# Patient Record
Sex: Male | Born: 2000 | Race: Black or African American | Hispanic: No | Marital: Single | Smoking: Never smoker
Health system: Southern US, Community
[De-identification: ages and names within clinical notes are randomized; demographics above are authoritative.]

## PROBLEM LIST (undated history)

## (undated) DIAGNOSIS — J45909 Unspecified asthma, uncomplicated: Secondary | ICD-10-CM

---
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
## 2020-04-04 IMAGING — CT CT RENAL STONE PROTOCOL
2 of 4 series · 16 of 46 positions shown, 18 images · non-contrast
Comparison: None.

CLINICAL DATA: 17-year-old male with acute RIGHT flank and
abdominal pain today.

EXAM:
CT ABDOMEN AND PELVIS WITHOUT CONTRAST
TECHNIQUE: Multidetector CT imaging of the abdomen and pelvis was performed
following the standard protocol without IV contrast.

[Series 2: stone full standard · axial · 0.72mm/px · z∈[-496,-71]mm · 13 of 93 slices shown, 15 images]
[im 4/93  soft-tissue]
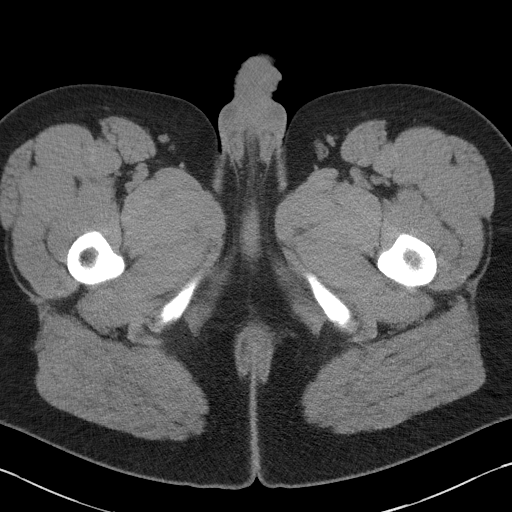
[im 4/93  bone]
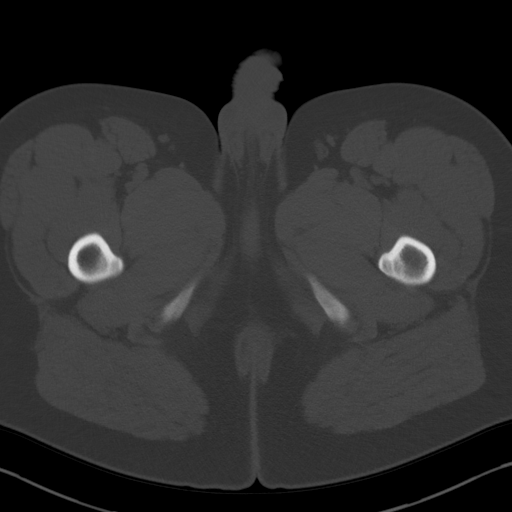
[im 12/93  soft-tissue]
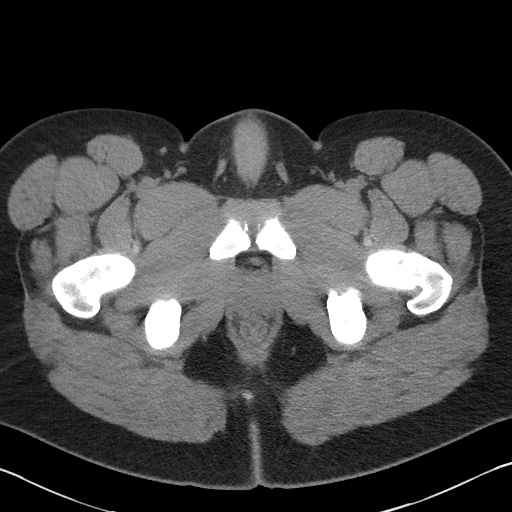
[im 20/93  soft-tissue]
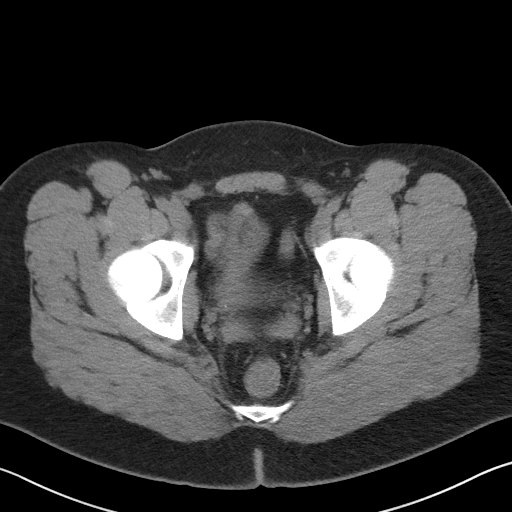
[im 27/93  soft-tissue]
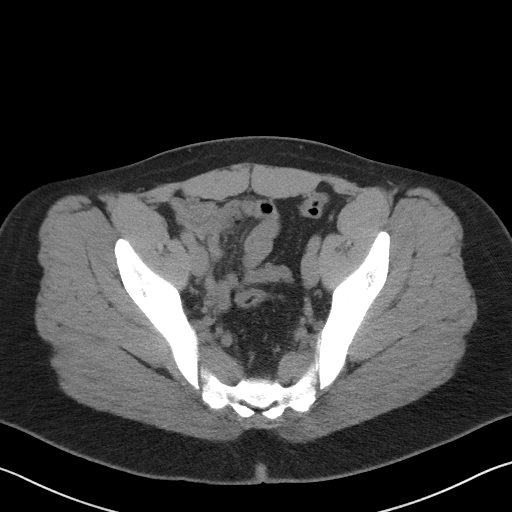
[im 31/93  soft-tissue]
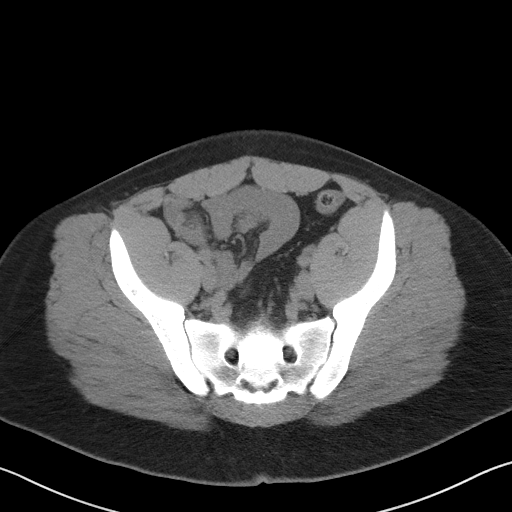
[im 39/93  soft-tissue]
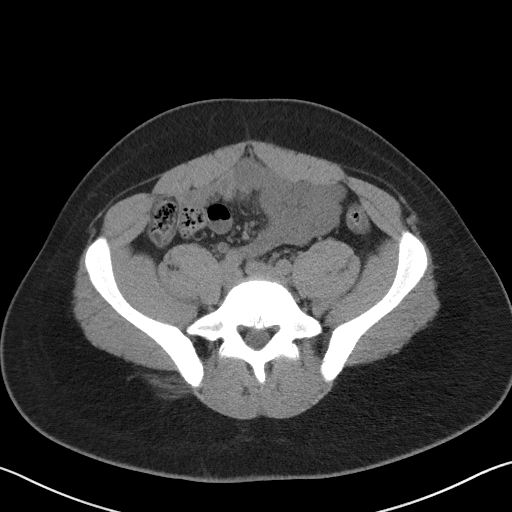
[im 47/93  soft-tissue]
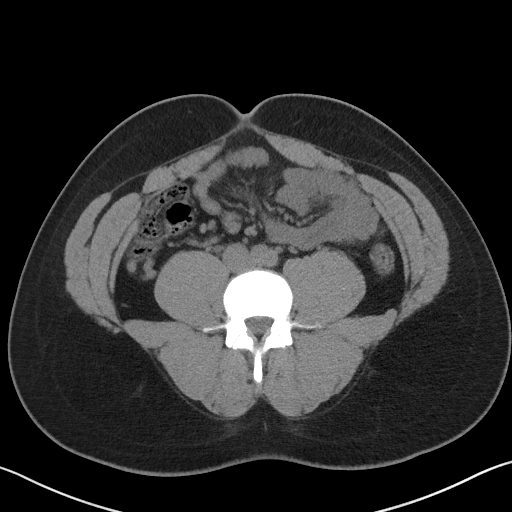
[im 54/93  soft-tissue]
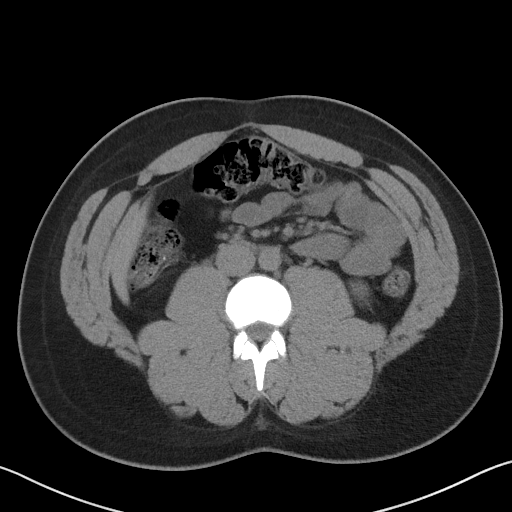
[im 62/93  soft-tissue]
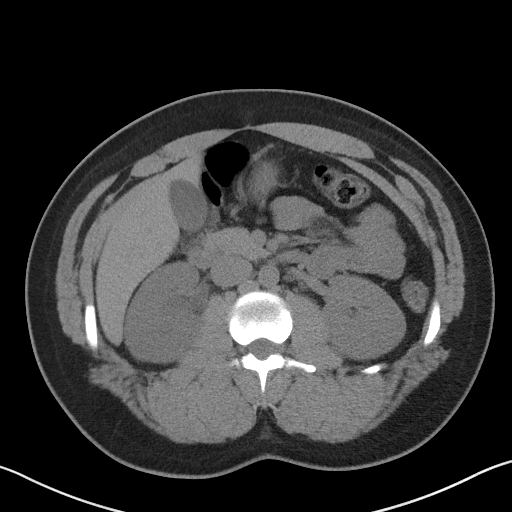
[im 62/93  bone]
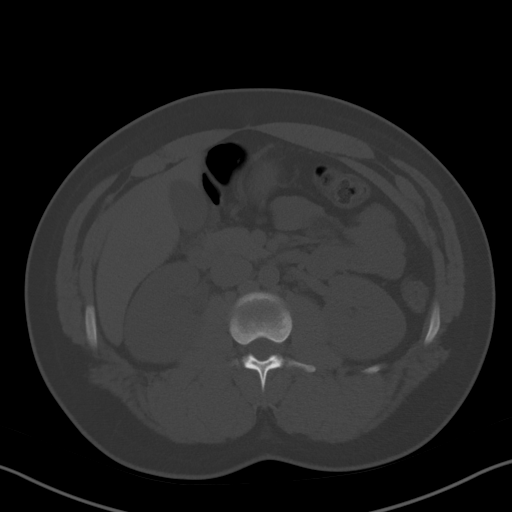
[im 66/93  soft-tissue]
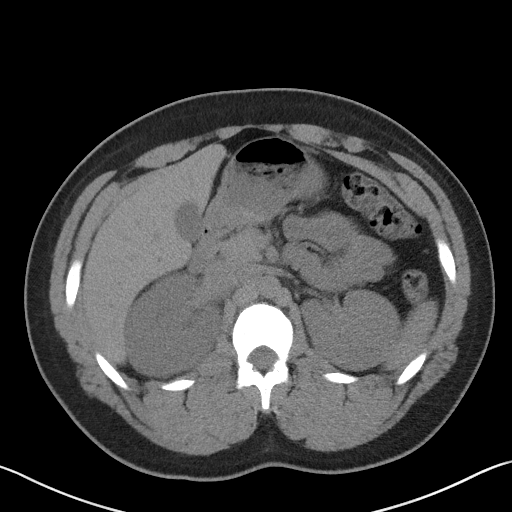
[im 73/93  soft-tissue]
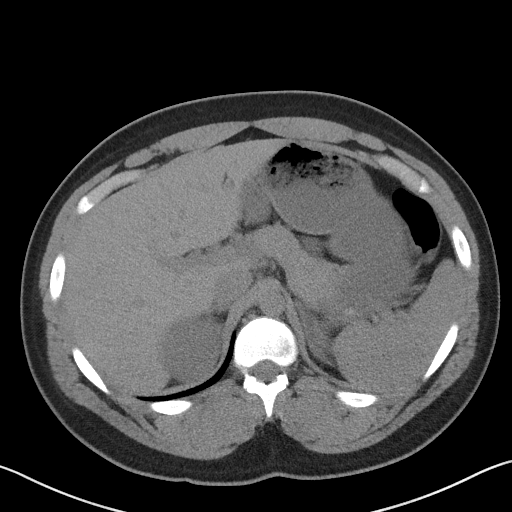
[im 81/93  soft-tissue]
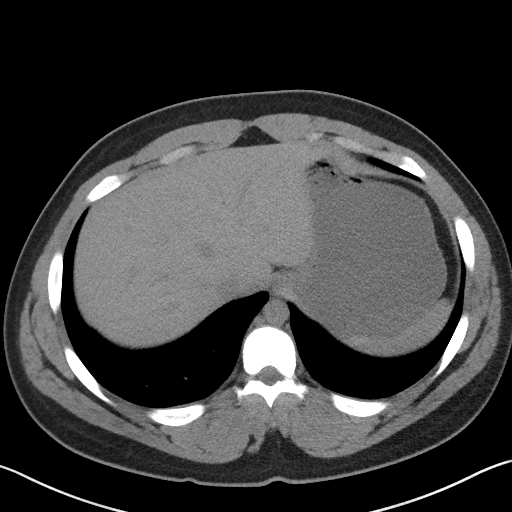
[im 89/93  soft-tissue]
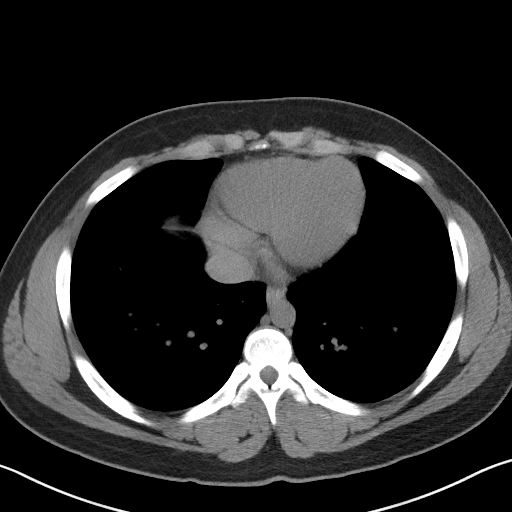

[Series 5: coronal · coronal · 0.72mm/px · 3 of 150 slices shown]
[im 50/150  soft-tissue]
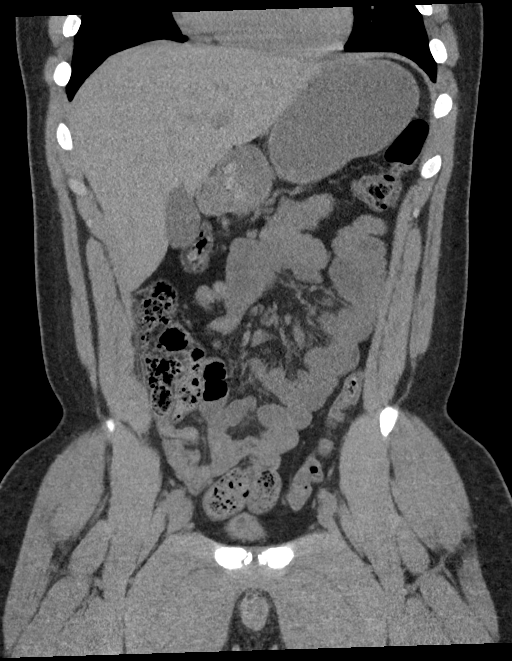
[im 67/150  soft-tissue]
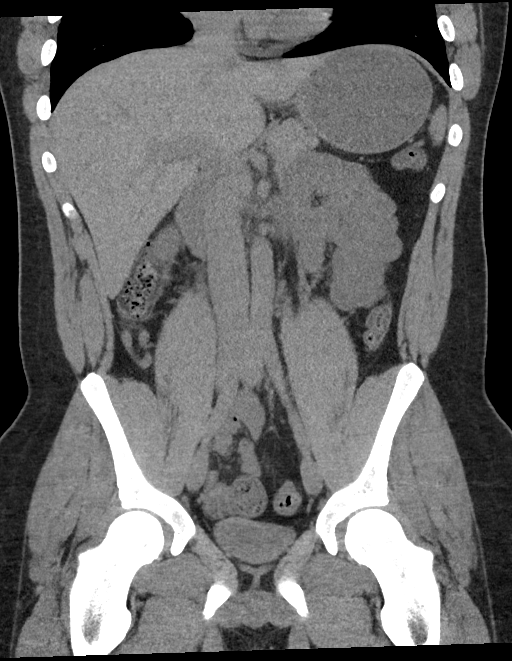
[im 83/150  soft-tissue]
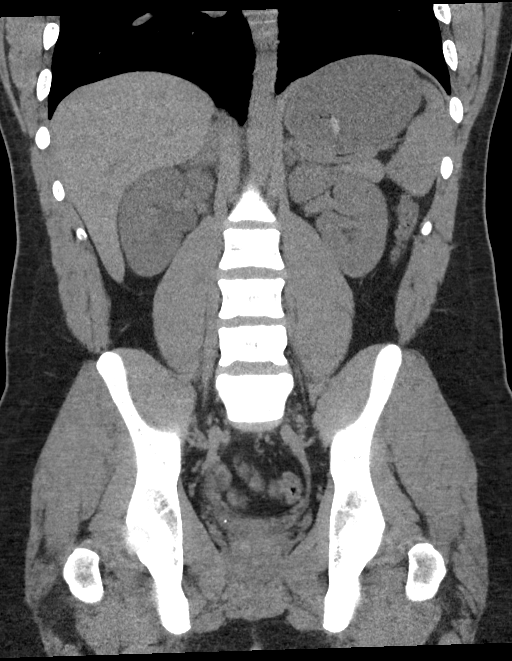

[16 of 46 positions shown; findings below may reference images not displayed]

FINDINGS: Please note that parenchymal abnormalities may be missed without
intravenous contrast.

Lower chest: No acute abnormality.

Hepatobiliary: The liver and gallbladder are unremarkable. No
biliary dilatation.

Pancreas: Unremarkable

Spleen: Unremarkable

Adrenals/Urinary Tract: A 1 mm RIGHT UVJ calculus causes mild RIGHT
hydroureteronephrosis. No other definite urinary calculi identified.
No other renal abnormalities noted. The adrenal glands are
unremarkable.

Stomach/Bowel: Stomach is within normal limits. Appendix appears
normal. No evidence of bowel wall thickening, distention, or
inflammatory changes.

Vascular/Lymphatic: No significant vascular findings are present. No
enlarged abdominal or pelvic lymph nodes.

Reproductive: Prostate is unremarkable.

Other: No ascites, pneumoperitoneum or definite collection.

Musculoskeletal: No acute or suspicious bony abnormalities.
IMPRESSION: 1 mm RIGHT UVJ calculus causing mild RIGHT hydronephrosis.

## 2021-09-09 IMAGING — CR DG KNEE COMPLETE 4+V*R*
1 series · 4 of 4 positions shown · non-contrast
Comparison: None.

CLINICAL DATA: Right knee pain

EXAM:
RIGHT KNEE - COMPLETE 4+ VIEW

[Series 1: dg knee complete 4 views right · 0.14mm/px · 4 of 4 slices shown]
[im 1/4]
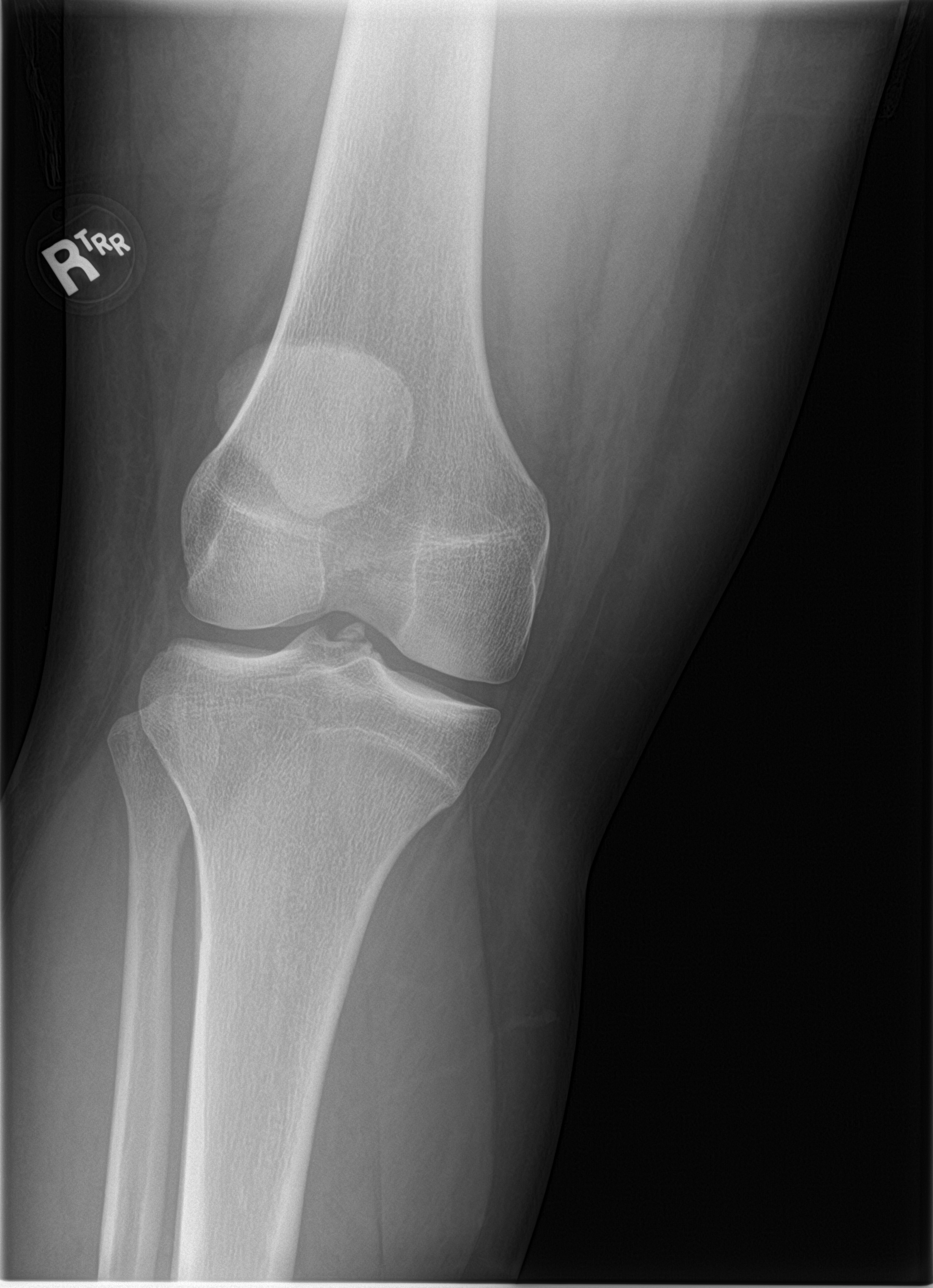
[im 2/4]
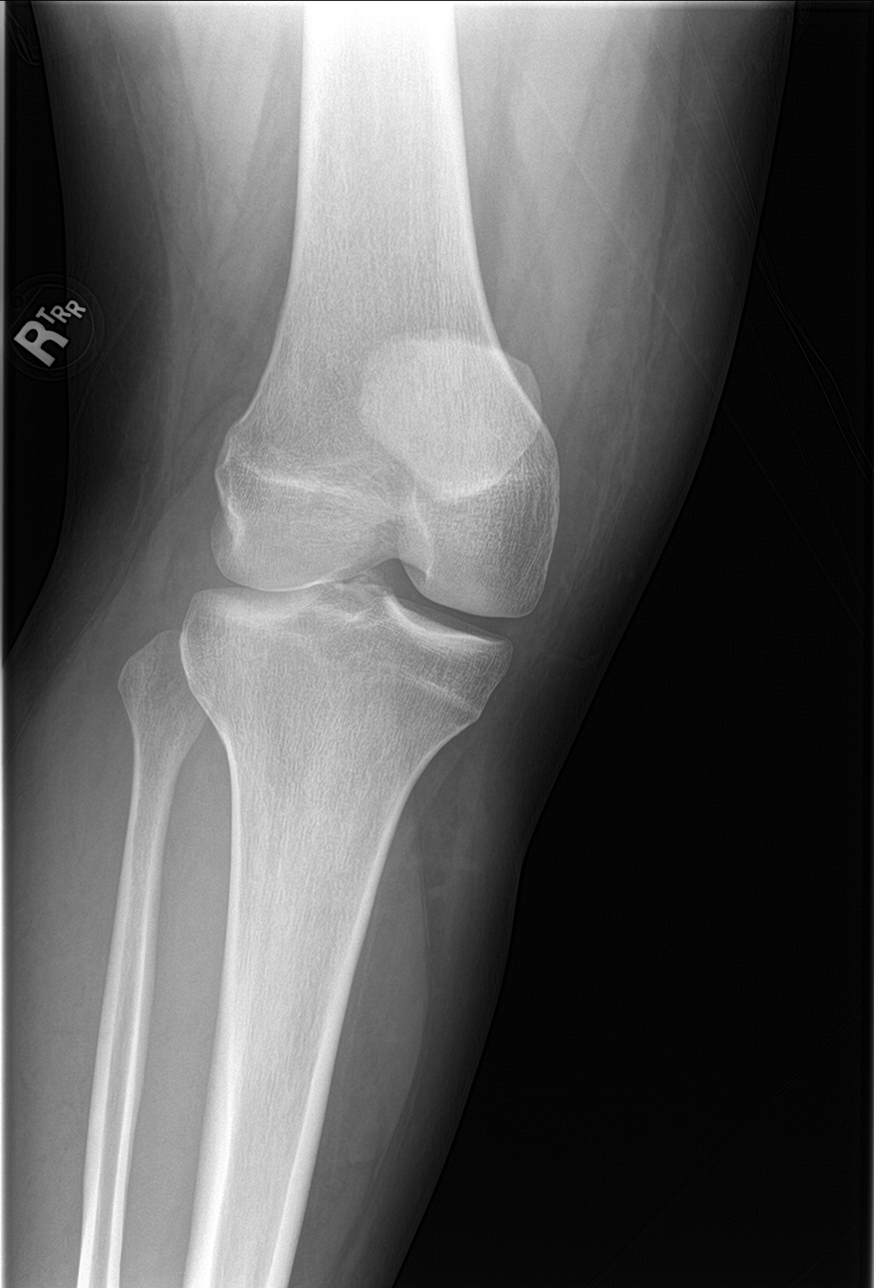
[im 3/4]
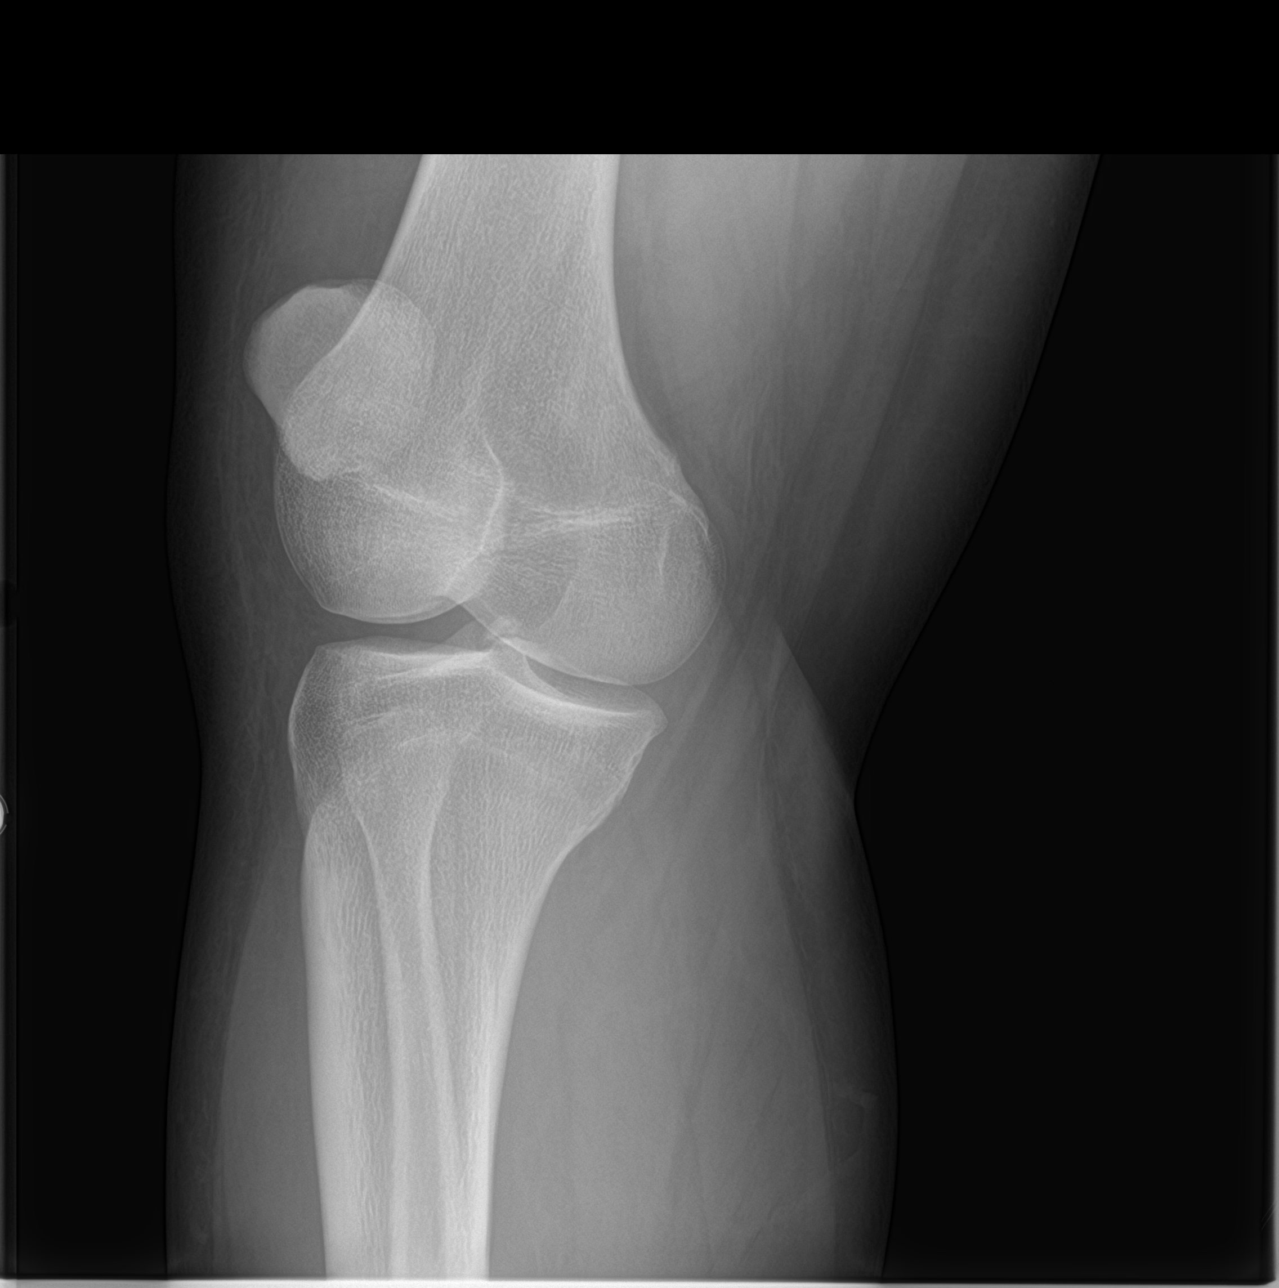
[im 4/4]
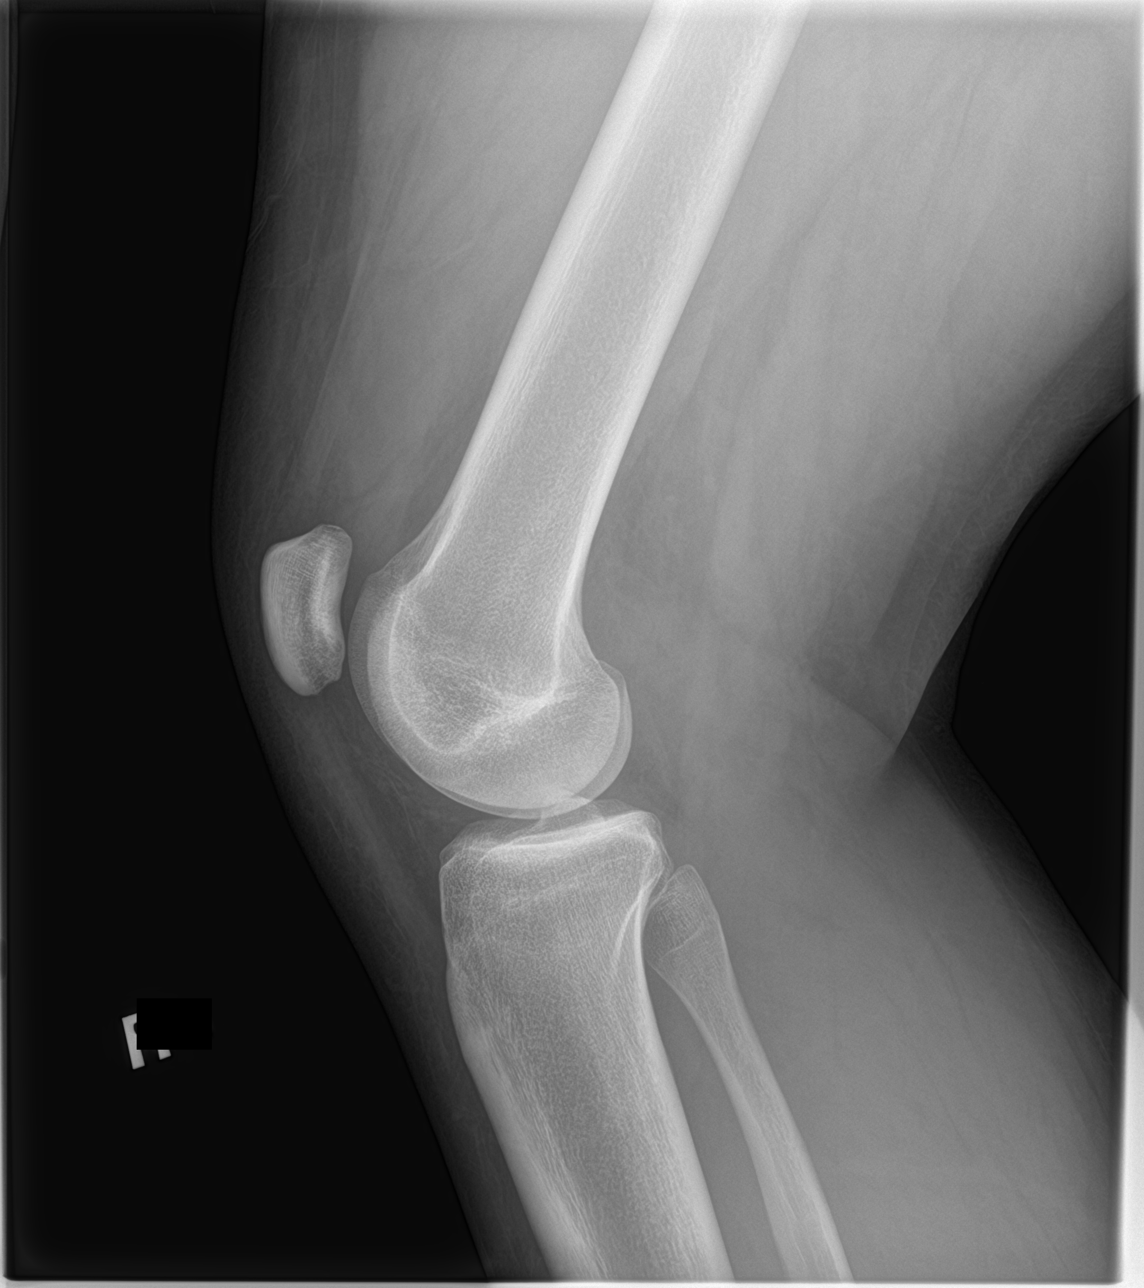

[4 of 4 positions shown; findings below may reference images not displayed]

FINDINGS: Small knee effusion. Joint spaces are maintained. Potential loose
body or intra-articular bone fragment near the tibial spines.
IMPRESSION: Small knee effusion. Possible loose body versus intra-articular bone
fragment adjacent to the tibial spines.
